# Patient Record
Sex: Male | Born: 2005 | Race: White | Hispanic: No | Marital: Single | Smoking: Never smoker
Health system: Southern US, Community
[De-identification: ages and names within clinical notes are randomized; demographics above are authoritative.]

## PROBLEM LIST (undated history)

## (undated) DIAGNOSIS — T8859XA Other complications of anesthesia, initial encounter: Secondary | ICD-10-CM

## (undated) DIAGNOSIS — J45909 Unspecified asthma, uncomplicated: Secondary | ICD-10-CM

## (undated) DIAGNOSIS — L2089 Other atopic dermatitis: Secondary | ICD-10-CM

## (undated) DIAGNOSIS — T4145XA Adverse effect of unspecified anesthetic, initial encounter: Secondary | ICD-10-CM

## (undated) HISTORY — PX: TONSILLECTOMY: SUR1361

## (undated) HISTORY — DX: Unspecified asthma, uncomplicated: J45.909

## (undated) HISTORY — PX: ARTHROSCOPIC REPAIR ACL: SUR80

## (undated) HISTORY — PX: ADENOIDECTOMY: SUR15

---
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
## 2024-02-19 DIAGNOSIS — S53401A Unspecified sprain of right elbow, initial encounter: Secondary | ICD-10-CM | POA: Diagnosis not present
